# Patient Record
Sex: Female | Born: 1935 | Race: White | Hispanic: No | Marital: Single | Smoking: Former smoker
Health system: Southern US, Community
[De-identification: ages and names within clinical notes are randomized; demographics above are authoritative.]

## PROBLEM LIST (undated history)

## (undated) HISTORY — PX: BACK SURGERY: SHX140

## (undated) HISTORY — PX: TOTAL THYROIDECTOMY: SHX2547

## (undated) HISTORY — PX: MENISCECTOMY: SHX123

## (undated) HISTORY — PX: CATARACT EXTRACTION, BILATERAL: SHX1313

---

## 2010-10-28 ENCOUNTER — Ambulatory Visit (HOSPITAL_COMMUNITY)
Admission: RE | Admit: 2010-10-28 | Discharge: 2010-10-28 | Disposition: A | Discharge status: Home / Self Care | Payer: Medicare Other | Source: Ambulatory Visit | Attending: Orthopedic Surgery | Admitting: Orthopedic Surgery

## 2010-10-28 ENCOUNTER — Other Ambulatory Visit (HOSPITAL_COMMUNITY): Payer: Self-pay | Admitting: Orthopedic Surgery

## 2010-10-28 ENCOUNTER — Encounter (HOSPITAL_COMMUNITY)
Admission: RE | Admit: 2010-10-28 | Discharge: 2010-10-28 | Disposition: A | Discharge status: Home / Self Care | Payer: Medicare Other | Source: Ambulatory Visit | Attending: Orthopedic Surgery | Admitting: Orthopedic Surgery

## 2010-10-28 DIAGNOSIS — Z01812 Encounter for preprocedural laboratory examination: Secondary | ICD-10-CM | POA: Insufficient documentation

## 2010-10-28 DIAGNOSIS — M51379 Other intervertebral disc degeneration, lumbosacral region without mention of lumbar back pain or lower extremity pain: Secondary | ICD-10-CM | POA: Insufficient documentation

## 2010-10-28 DIAGNOSIS — I10 Essential (primary) hypertension: Secondary | ICD-10-CM | POA: Insufficient documentation

## 2010-10-28 DIAGNOSIS — M5136 Other intervertebral disc degeneration, lumbar region: Secondary | ICD-10-CM

## 2010-10-28 DIAGNOSIS — Z01818 Encounter for other preprocedural examination: Secondary | ICD-10-CM | POA: Insufficient documentation

## 2010-10-28 DIAGNOSIS — M5137 Other intervertebral disc degeneration, lumbosacral region: Secondary | ICD-10-CM | POA: Insufficient documentation

## 2010-10-28 LAB — COMPREHENSIVE METABOLIC PANEL
ALT: 36 U/L — ABNORMAL HIGH (ref 0–35)
BUN: 15 mg/dL (ref 6–23)
CO2: 29 mEq/L (ref 19–32)
Calcium: 9.4 mg/dL (ref 8.4–10.5)
GFR calc non Af Amer: >60 mL/min (ref >60)
Glucose, Bld: 109 mg/dL — ABNORMAL HIGH (ref 70–99)
Sodium: 137 mEq/L (ref 135–145)

## 2010-10-28 LAB — URINALYSIS, ROUTINE W REFLEX MICROSCOPIC
Bilirubin Urine: NEGATIVE
Hgb urine dipstick: NEGATIVE
Nitrite: NEGATIVE
Specific Gravity, Urine: 1.014 (ref 1.005–1.030)
Urobilinogen, UA: 0.2 mg/dL (ref 0.0–1.0)
pH: 6 (ref 5.0–8.0)

## 2010-10-28 LAB — DIFFERENTIAL
Basophils Absolute: 0.1 10*3/uL (ref 0.0–0.1)
Basophils Relative: 1 % (ref 0–1)
Lymphocytes Relative: 26 % (ref 12–46)
Neutro Abs: 7.8 10*3/uL — ABNORMAL HIGH (ref 1.7–7.7)
Neutrophils Relative %: 66 % (ref 43–77)

## 2010-10-28 LAB — CBC
HCT: 46.8 % — ABNORMAL HIGH (ref 36.0–46.0)
Hemoglobin: 15.8 g/dL — ABNORMAL HIGH (ref 12.0–15.0)
RBC: 5.42 MIL/uL — ABNORMAL HIGH (ref 3.87–5.11)
WBC: 11.9 10*3/uL — ABNORMAL HIGH (ref 4.0–10.5)

## 2010-10-28 LAB — PROTIME-INR
INR: 0.93 (ref 0.00–1.49)
Prothrombin Time: 12.7 seconds (ref 11.6–15.2)

## 2010-10-28 LAB — SURGICAL PCR SCREEN: Staphylococcus aureus: NEGATIVE

## 2010-10-29 ENCOUNTER — Inpatient Hospital Stay (HOSPITAL_COMMUNITY): Payer: Medicare Other

## 2010-10-29 ENCOUNTER — Inpatient Hospital Stay (HOSPITAL_COMMUNITY)
Admission: RE | Admit: 2010-10-29 | Discharge: 2010-10-30 | DRG: 491 | Disposition: A | Discharge status: Home / Self Care | Payer: Medicare Other | Source: Ambulatory Visit | Attending: Orthopedic Surgery | Admitting: Orthopedic Surgery

## 2010-10-29 ENCOUNTER — Other Ambulatory Visit: Payer: Self-pay | Admitting: Orthopedic Surgery

## 2010-10-29 DIAGNOSIS — E039 Hypothyroidism, unspecified: Secondary | ICD-10-CM | POA: Diagnosis present

## 2010-10-29 DIAGNOSIS — F411 Generalized anxiety disorder: Secondary | ICD-10-CM | POA: Diagnosis present

## 2010-10-29 DIAGNOSIS — M129 Arthropathy, unspecified: Secondary | ICD-10-CM | POA: Diagnosis present

## 2010-10-29 DIAGNOSIS — Z87891 Personal history of nicotine dependence: Secondary | ICD-10-CM

## 2010-10-29 DIAGNOSIS — M5126 Other intervertebral disc displacement, lumbar region: Principal | ICD-10-CM | POA: Diagnosis present

## 2010-10-29 LAB — DIFFERENTIAL
Basophils Relative: 1 % (ref 0–1)
Eosinophils Absolute: 0.2 10*3/uL (ref 0.0–0.7)
Monocytes Absolute: 0.7 10*3/uL (ref 0.1–1.0)
Monocytes Relative: 8 % (ref 3–12)
Neutrophils Relative %: 67 % (ref 43–77)

## 2010-10-29 LAB — CBC
MCH: 29.1 pg (ref 26.0–34.0)
MCHC: 34.2 g/dL (ref 30.0–36.0)
Platelets: 215 10*3/uL (ref 150–400)
RBC: 5.12 MIL/uL — ABNORMAL HIGH (ref 3.87–5.11)

## 2010-11-02 NOTE — Op Note (Signed)
NAME:  Andrea Woodward, Andrea Woodward NO.:  [id number]  MEDICAL RECORD NO.:  [id number]           PATIENT TYPE:  I  LOCATION:  3534                         FACILITY:  [facility]  PHYSICIAN:  Nelda Severe, MD      DATE OF BIRTH:  [birth year]  DATE OF PROCEDURE:  10/29/2010 DATE OF DISCHARGE:                              OPERATIVE REPORT   SURGEON:  Nelda Severe, MD  ASSISTANT:  Lianne Cure, PA-C.  PREOPERATIVE DIAGNOSIS:  Left L4-5 disk herniation with left sciatic pain.  POSTOPERATIVE DIAGNOSIS:  Left L4-5 disk herniation with left sciatic pain.  OPERATIVE PROCEDURE:  Left L4-5 laminotomy and disk excision.  OPERATIVE NOTE:  The patient was placed under general endotracheal anesthesia.  Leg squeezers were applied to both lower extremities. Intravenous vancomycin was given as prophylaxis.  She was positioned prone on the [place] frame.  Care was taken to position the upper extremities so as to avoid hyperflexion and abduction of the shoulders so as to avoid hyperflexion of the elbows.  Upper extremities were padded with foam from axilla to hands.  Thighs, knees, shins, and ankles were supported on pillows.  A vertical midline mark was made with a skin marker at the proposed site of incision over what I perceived to be the L4-5 level.  The lumbar area was prepped with DuraPrep and draped in rectangular fashion, and the drapes were secured with Ioban.  Time-out was then held, at which point, the patient's identity, preoperative diagnosis, intended procedure, etc. etc. were all confirmed/discussed.  The skin was then scored over the previously made skin marker. Subcutaneous tissue was injected with a mixture of 0.25% plain Marcaine and 1% lidocaine with epinephrine.  Incision was then deepened using cutting cautery to the thoracolumbar fascia.  Thoracolumbar fascia was split just left to the midline.  Paraspinal muscle reflected bilaterally and a Kocher clamp was placed  on the trailing edge of what I perceived to be the L4 lamina.  Cross-table lateral radiograph was taken and proved to be L3.  We therefore mobilized the paraspinal muscle one space distally and placed a Cirigliano retractor lateral to the L4-5 apophyseal joint.  A high-speed bur was used to perform medial facetectomy of the inferior articular process on the left at L5 and partial laminectomy. The laminotomy site was then enlarged with Kerrison rongeurs.  The ligamentum flavum was detached laterally from the undersurface of the superior articular process and removed as necessary to expose the dura. We then visualized what appeared initially to be a synovial cyst, proved actually be the nerve root, tented backwards by the underlying disk fragment just medial to the pedicle of the L5 vertebra.  The root was mobilized off the disk fragment and several large fragments which were free in the spinal canal removed with a pituitary rongeur.  Canal was then further swept with a nerve hook and more fragments delivered.  I then moved proximally to the disk space which was incised with a #15 blade.  We then used Epstein curettes to push protruding disk back into the disk space and delivered it with pituitary rongeur.  Multiple fragments  were delivered from inside the disk.  The disk endplates were curetted with a ring curette and more fragments delivered.  At this point, I felt that the nerve root was well decompressed and had made all efforts possible to prevent disk herniation recurrence.  A probe was placed in the disk space and a cross-table lateral radiograph taken to confirm and document that we were at the level we intended to be, namely L4-5.  A 15-gauge Blake drain was then placed subfascially, brought out through the skin to the left side after applying bone wax to the oozing surfaces of the laminectomy bone.  We then closed the thoracolumbar fascia using interrupted #1 Vicryl suture.  The  subcutaneous layer was closed using inverted 2-0 undyed Vicryl and the skin was closed with undyed subcuticular running 3-0 Vicryl.  Steri-Strips were used to reinforce the skin edges and a nonadherent dressing was applied.  The patient's blood loss estimated less than 100 mL.  There were no intraoperative complications.  The patient has not yet awakened, so no neurologic report is made in this operative report.     Nelda Severe, MD     MT/MEDQ  D:  10/29/2010  T:  10/30/2010  Job:  [id number]  Electronically Signed by Nelda Severe MD on 11/02/2010 10:49:37 AM

## 2010-11-25 NOTE — Discharge Summary (Signed)
  NAME:  Andrea Woodward, Andrea Woodward NO.:  [id number]  MEDICAL RECORD NO.:  [id number]           PATIENT TYPE:  I  LOCATION:  3534                         FACILITY:  [facility]  PHYSICIAN:  Nelda Severe, MD      DATE OF BIRTH:  [birth year]  DATE OF ADMISSION:  10/29/2010 DATE OF DISCHARGE:                              DISCHARGE SUMMARY   FINAL DIAGNOSIS:  L4-L5 disk herniation on the left side with left sciatic L5 distribution.  BRIEF HISTORY:  She was brought to [facility] on October 29, 2010, and underwent diskectomy by Dr. Nelda Severe.  Estimated blood loss less than 100 mL.  Grossly neurovascularly motor intact, stable postoperatively.  She was admitted to 3534.  She was ambulating with standby assist.  She states her leg pain is significantly better.  She does have some back pain at the incision, but overall she is very pleased with her outcome.  Distally on exam, neurovascularly motor intact.  I discontinued the drain, put a clean dry dressing over the incision.  PLAN:  She will walk for exercise.  No bending, stooping, lifting more than 5 pounds.  We will send her home with hydrocodone 10/325 one to two q.4 p.r.n. for pain as well as Robaxin 500 mg 1-2 q.6 p.r.n. for muscle spasms.  I told her she is welcome to take over-the-counter Advil and Tylenol if the pain is not significant enough for hydrocodone and she will follow up in office in 4 weeks.  FINAL DIAGNOSIS:  L4-L5 left-sided disk herniation.  She is stable.  DIET:  Regular.     Lianne Cure, P.A.   ______________________________ Nelda Severe, MD    MC/MEDQ  D:  10/30/2010  T:  10/30/2010  Job:  [id number]  Electronically Signed by Lianne Cure P.A. on 11/13/2010 02:53:41 PM Electronically Signed by Nelda Severe MD on 11/25/2010 02:12:55 PM

## 2010-11-25 NOTE — H&P (Signed)
  NAME:  Andrea Woodward, TOOL NO.:  [id number]  MEDICAL RECORD NO.:  [id number]           PATIENT TYPE:  LOCATION:                                 FACILITY:  PHYSICIAN:  Nelda Severe, MD      DATE OF BIRTH:  Dec 28, 1935  DATE OF ADMISSION: DATE OF DISCHARGE:                             HISTORY & PHYSICAL   CHIEF COMPLAINT:  Left leg pain, L4-5 radicular distribution with herniated nucleus pulposus.  PAST MEDICAL HISTORY:  Includes no known drug allergies.  MEDICATIONS:  Include Levoxyl and Klonopin.  She has a history of anxiety and hypothyroidism, history of L4-5 disk annular tear/herniation.  FAMILY HISTORY:  Negative for hypertension.  No cancer.  No coronary artery disease.  No diabetes.  REVIEW OF SYSTEMS:  She reports no fever, no chills.  No nausea, vomiting, or diarrhea.  No hemoptysis.  No melena.  No shortness of breath.  No chest pain.  PHYSICAL EXAMINATION:  HEENT:  She is normocephalic in appearance. Pupils are equal, round, reactive to light. CHEST:  Clear to auscultation.  No wheezes were noted. HEART:  Regular rate and rhythm.  No murmurs were noted. ABDOMEN:  Soft, nontender to palpation.  Positive bowel sounds were auscultated. EXTREMITIES:  She has a positive femoral nerve stretch with radicular pain, left anterior thigh to great toe.  EHL is 0/5.  MRI reviewed, shows L4-5 left-sided herniated nucleus pulposus.  OPERATIVE PLAN:  Diskectomy by Dr. Nelda Severe.     Lianne Cure, P.A.   ______________________________ Nelda Severe, MD    MC/MEDQ  D:  10/25/2010  T:  10/25/2010  Job:  [id number]  Electronically Signed by Lianne Cure P.A. on 11/13/2010 02:53:48 PM Electronically Signed by Nelda Severe MD on 11/25/2010 02:13:10 PM

## 2011-01-07 DIAGNOSIS — I071 Rheumatic tricuspid insufficiency: Secondary | ICD-10-CM | POA: Insufficient documentation

## 2011-01-07 DIAGNOSIS — E890 Postprocedural hypothyroidism: Secondary | ICD-10-CM | POA: Insufficient documentation

## 2012-01-08 ENCOUNTER — Other Ambulatory Visit: Payer: Self-pay | Admitting: Family Medicine

## 2012-01-08 DIAGNOSIS — Z780 Asymptomatic menopausal state: Secondary | ICD-10-CM

## 2012-01-08 DIAGNOSIS — Z1231 Encounter for screening mammogram for malignant neoplasm of breast: Secondary | ICD-10-CM

## 2012-02-06 ENCOUNTER — Other Ambulatory Visit: Payer: PRIVATE HEALTH INSURANCE

## 2012-02-06 ENCOUNTER — Ambulatory Visit: Payer: PRIVATE HEALTH INSURANCE

## 2012-02-23 ENCOUNTER — Ambulatory Visit
Admission: RE | Admit: 2012-02-23 | Discharge: 2012-02-23 | Disposition: A | Discharge status: Home / Self Care | Payer: PRIVATE HEALTH INSURANCE | Source: Ambulatory Visit | Attending: Family Medicine | Admitting: Family Medicine

## 2012-02-23 DIAGNOSIS — Z780 Asymptomatic menopausal state: Secondary | ICD-10-CM

## 2012-02-23 DIAGNOSIS — Z1231 Encounter for screening mammogram for malignant neoplasm of breast: Secondary | ICD-10-CM

## 2012-04-04 DIAGNOSIS — M8589 Other specified disorders of bone density and structure, multiple sites: Secondary | ICD-10-CM | POA: Insufficient documentation

## 2012-04-16 DIAGNOSIS — F411 Generalized anxiety disorder: Secondary | ICD-10-CM | POA: Insufficient documentation

## 2012-08-27 IMAGING — CR DG CHEST 2V
2 series · 2 of 2 positions shown · non-contrast
Comparison: None.

CLINICAL DATA: History of hypertension.  Preoperative
cardiopulmonary evaluation.

CHEST - 2 VIEW

[view not recorded (1 of 2)]
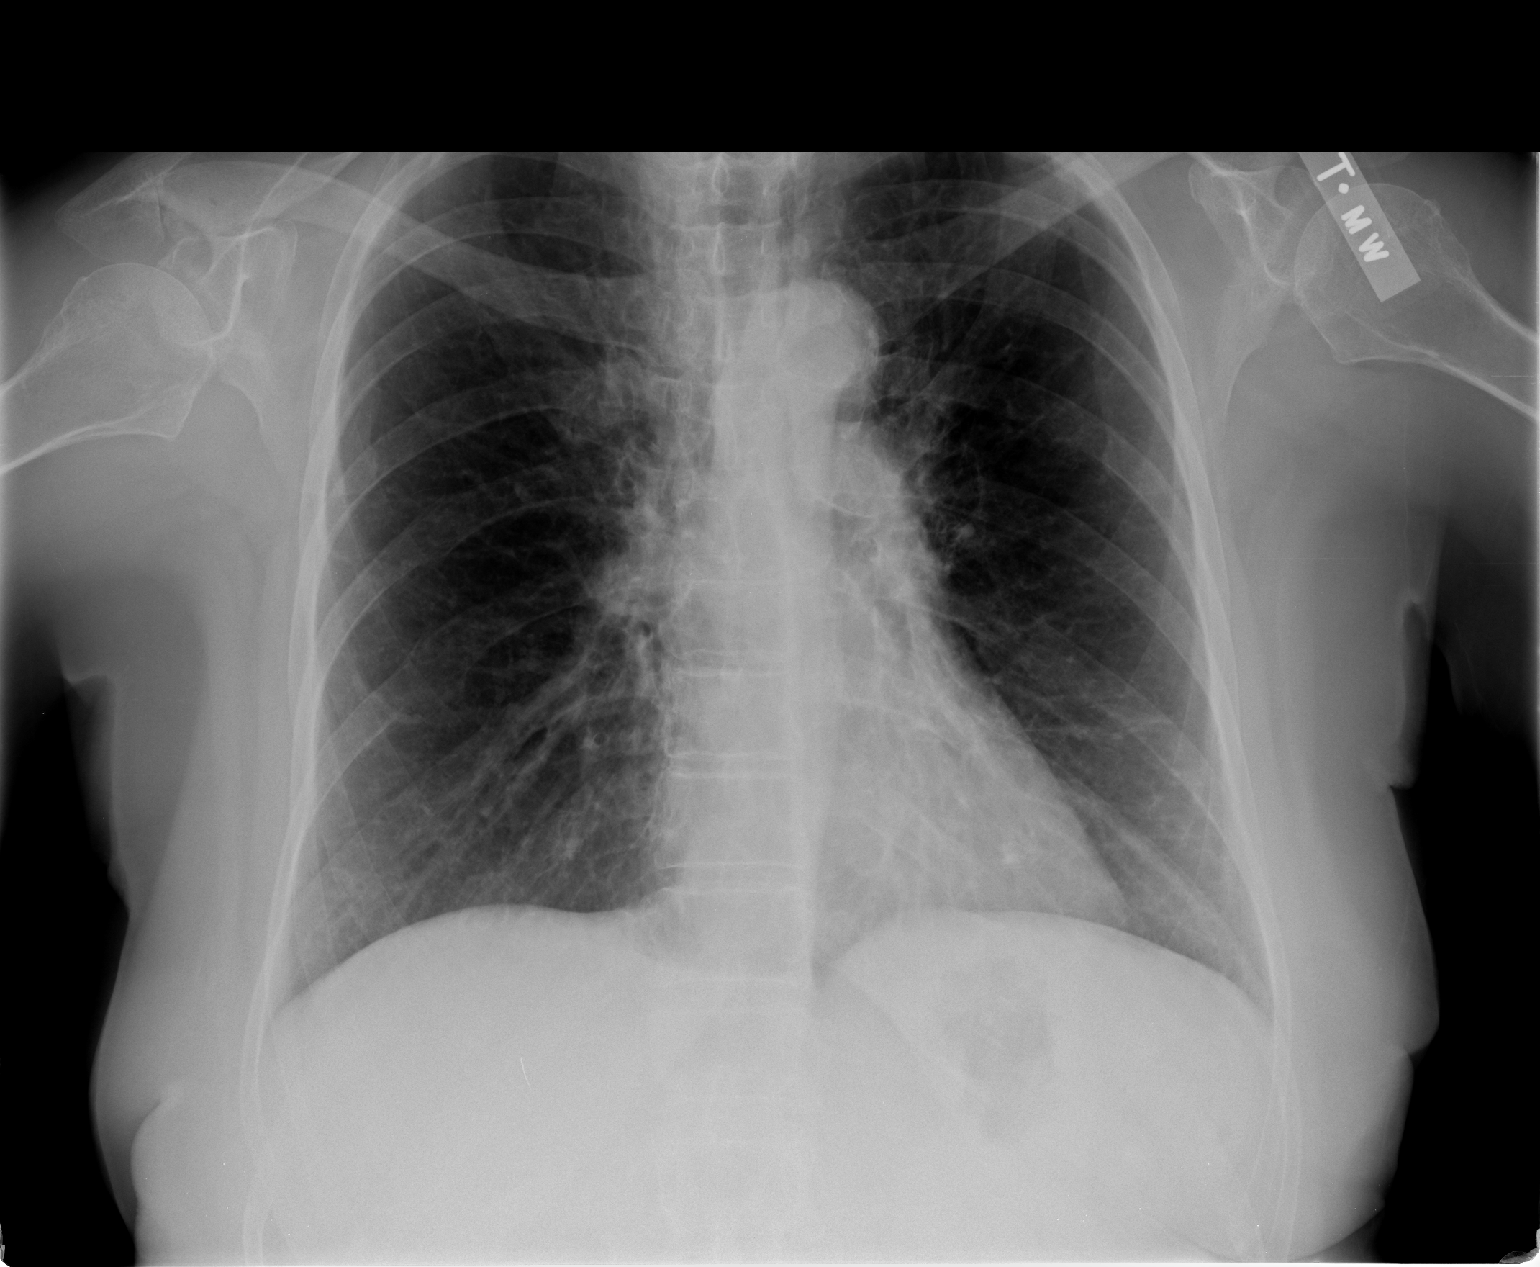

[view not recorded (2 of 2)]
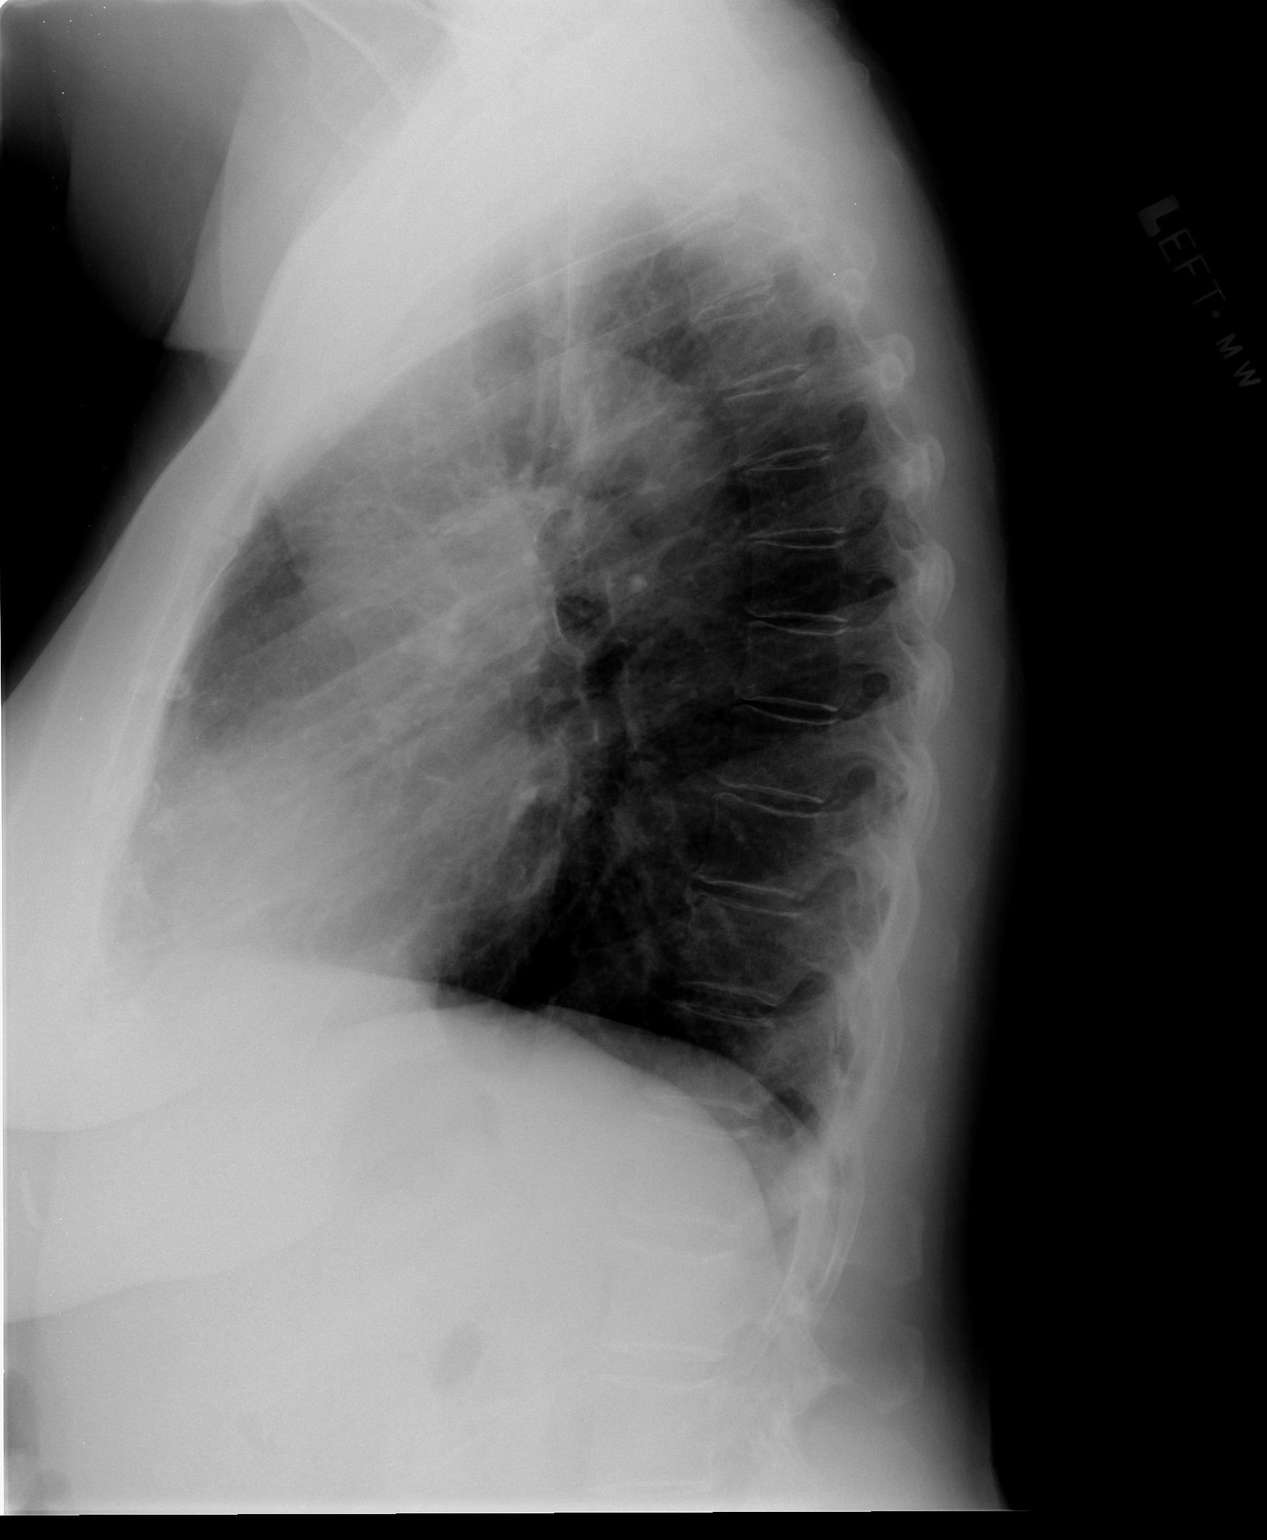

[2 of 2 positions shown; findings below may reference images not displayed]

FINDINGS: The cardiac silhouette is normal size and shape.  The
aortic ectasia is present.  No  hilar enlargement or adenopathy is
evident.  No pulmonary infiltrates are evident.  No pleural
effusion is seen.  There is a slightly osteopenic appearance of the
bones.
IMPRESSION: No acute or active cardiopulmonary process is identified.

## 2012-08-28 IMAGING — CR DG LUMBAR SPINE 2-3V
1 series · 1 of 1 positions shown · non-contrast
Comparison: None

CLINICAL DATA: L4-5 lumbar disc excision.

LUMBAR SPINE - 2-3 VIEW

[view not recorded]
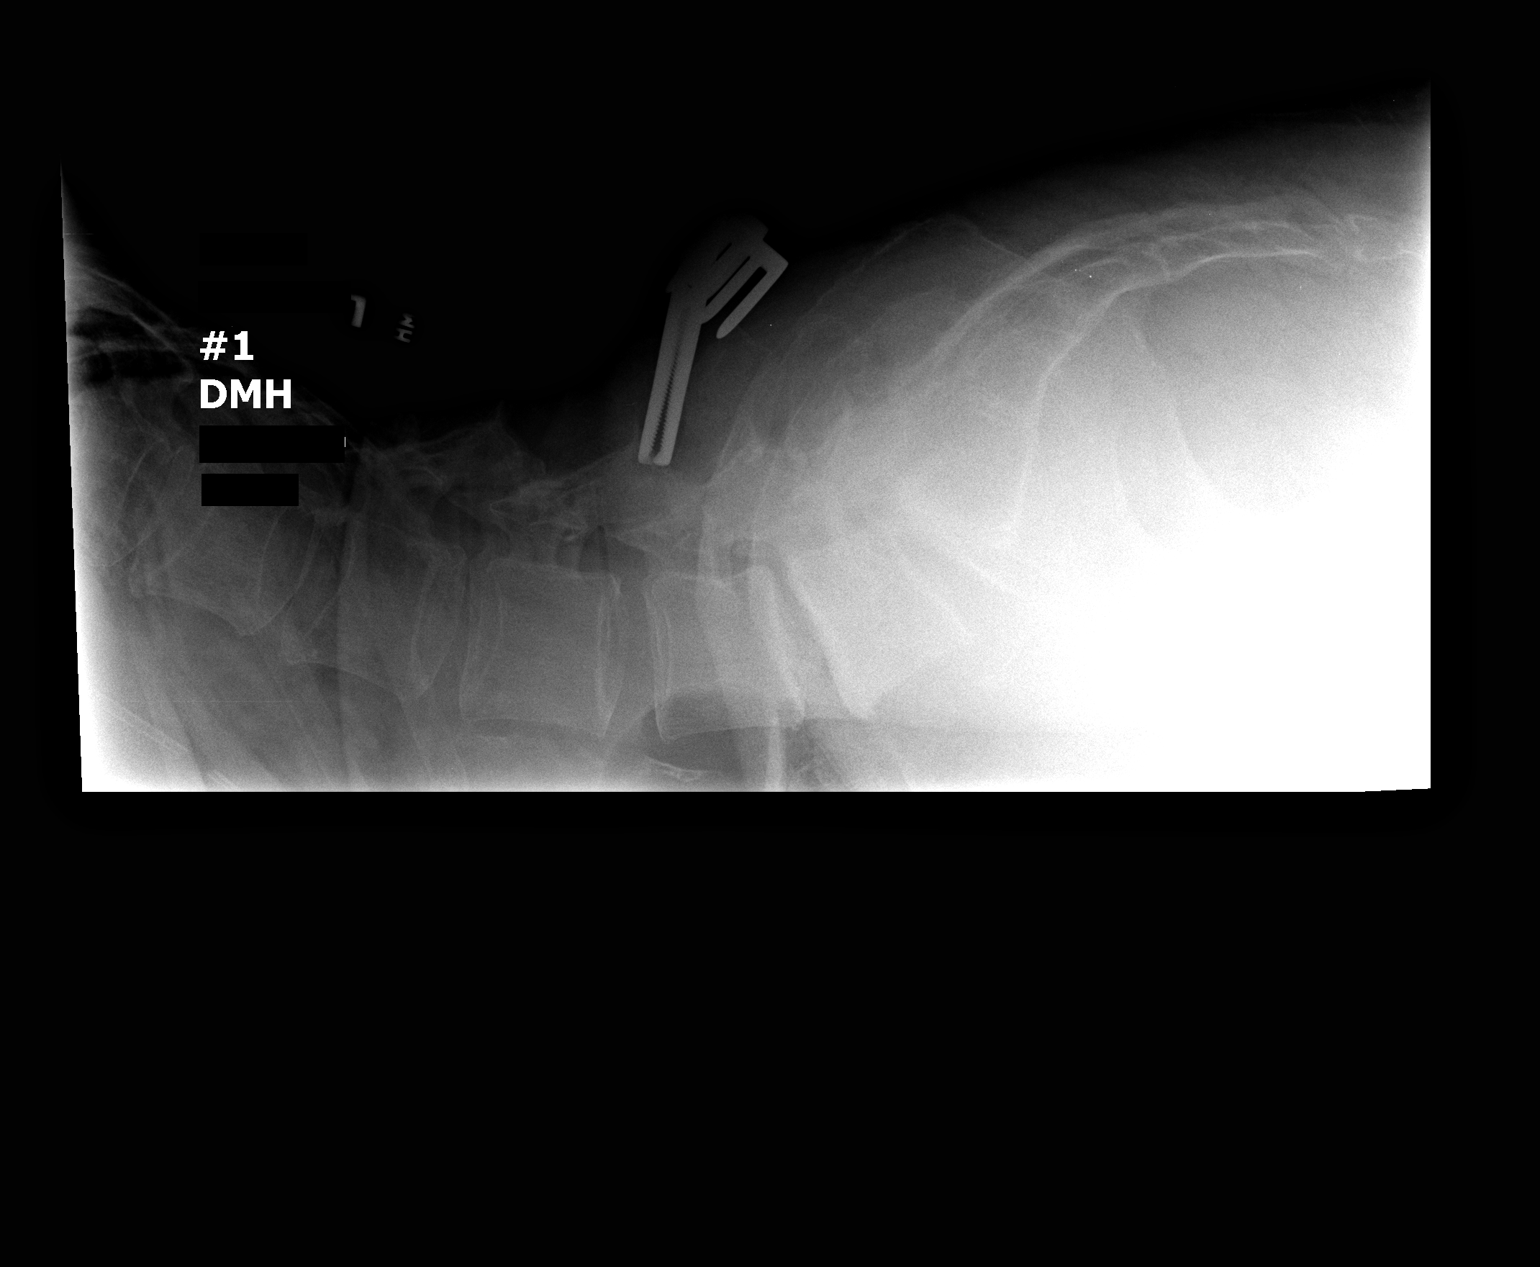

[1 of 1 positions shown; findings below may reference images not displayed]

FINDINGS: The film labeled #1 demonstrates a surgical clamp marking
the L3-4 disc space level.

The film labeled #2 demonstrates a surgical instrument in the L4-5
disc space.
IMPRESSION: Intraoperative localization of L4-5.

## 2014-01-05 DIAGNOSIS — J302 Other seasonal allergic rhinitis: Secondary | ICD-10-CM | POA: Insufficient documentation

## 2014-01-06 ENCOUNTER — Other Ambulatory Visit: Payer: Self-pay | Admitting: Family Medicine

## 2014-01-06 DIAGNOSIS — M810 Age-related osteoporosis without current pathological fracture: Secondary | ICD-10-CM

## 2014-01-11 DIAGNOSIS — E785 Hyperlipidemia, unspecified: Secondary | ICD-10-CM | POA: Insufficient documentation

## 2014-01-11 DIAGNOSIS — E559 Vitamin D deficiency, unspecified: Secondary | ICD-10-CM | POA: Insufficient documentation

## 2014-01-11 DIAGNOSIS — E1169 Type 2 diabetes mellitus with other specified complication: Secondary | ICD-10-CM | POA: Insufficient documentation

## 2014-03-07 ENCOUNTER — Other Ambulatory Visit: Payer: Medicare Other

## 2019-10-01 ENCOUNTER — Ambulatory Visit: Payer: Medicare Other

## 2019-10-06 ENCOUNTER — Ambulatory Visit: Payer: Medicare Other | Attending: Internal Medicine

## 2019-10-06 DIAGNOSIS — Z23 Encounter for immunization: Secondary | ICD-10-CM | POA: Insufficient documentation

## 2019-10-06 NOTE — Progress Notes (Signed)
   Covid-19 Vaccination Clinic  Name:  BARBAR LABELL    MRN: [id number] DOB: 1935/09/22  10/06/2019  Ms. Dasgupta was observed post Covid-19 immunization for 15 minutes without incidence. She was provided with Vaccine Information Sheet and instruction to access the V-Safe system.   Ms. Neylon was instructed to call 911 with any severe reactions post vaccine: Marland Kitchen Difficulty breathing  . Swelling of your face and throat  . A fast heartbeat  . A bad rash all over your body  . Dizziness and weakness    Immunizations Administered    Name Date Dose VIS Date Route   Pfizer COVID-19 Vaccine 10/06/2019  3:34 PM 0.3 mL 08/12/2019 Intramuscular   Manufacturer: [place]   Lot: [id number]   [place]: [id number]

## 2019-10-31 ENCOUNTER — Ambulatory Visit: Payer: Medicare Other | Attending: Internal Medicine

## 2019-10-31 DIAGNOSIS — Z23 Encounter for immunization: Secondary | ICD-10-CM | POA: Insufficient documentation

## 2019-10-31 NOTE — Progress Notes (Signed)
   Covid-19 Vaccination Clinic  Name:  Andrea Woodward    MRN: [id number] DOB: 19-Jan-1936  10/31/2019  [name] was observed post Covid-19 immunization for 15 minutes without incidence. She was provided with Vaccine Information Sheet and instruction to access the V-Safe system.   [name] was instructed to call 911 with any severe reactions post vaccine: Marland Kitchen Difficulty breathing  . Swelling of your face and throat  . A fast heartbeat  . A bad rash all over your body  . Dizziness and weakness    Immunizations Administered    Name Date Dose VIS Date Route   Pfizer COVID-19 Vaccine 10/31/2019  5:11 PM 0.3 mL 08/12/2019 Intramuscular   Manufacturer: [place]   Lot: [id number]   [place]: [id number]

## 2021-02-28 ENCOUNTER — Other Ambulatory Visit: Payer: Self-pay

## 2021-06-24 DIAGNOSIS — R011 Cardiac murmur, unspecified: Secondary | ICD-10-CM | POA: Insufficient documentation

## 2021-07-08 DIAGNOSIS — E1165 Type 2 diabetes mellitus with hyperglycemia: Secondary | ICD-10-CM | POA: Insufficient documentation

## 2021-08-21 DIAGNOSIS — C251 Malignant neoplasm of body of pancreas: Secondary | ICD-10-CM | POA: Insufficient documentation

## 2021-08-21 DIAGNOSIS — R7989 Other specified abnormal findings of blood chemistry: Secondary | ICD-10-CM | POA: Insufficient documentation

## 2021-08-28 ENCOUNTER — Telehealth: Payer: Self-pay

## 2021-08-28 NOTE — Telephone Encounter (Signed)
Attempted to contact patient's sister [name] to schedule a Palliative Care consult appointment. No answer left a message to return call.

## 2021-08-29 ENCOUNTER — Telehealth: Payer: Self-pay

## 2021-08-29 NOTE — Telephone Encounter (Signed)
Spoke with patient's sister [name] and scheduled an in-person Palliative Consult for 09/03/21 @ [facility] screening was negative. Small dog in the home will put away. Patient lives alone but sister and niece live in the same complex.  Consent obtained; updated Outlook/Netsmart/Team List and Epic.   Family is aware they may be receiving a call from provider the day before or day of to confirm appointment.

## 2021-09-03 ENCOUNTER — Encounter: Payer: Self-pay | Admitting: Family Medicine

## 2021-09-03 ENCOUNTER — Other Ambulatory Visit: Payer: Self-pay

## 2021-09-03 ENCOUNTER — Other Ambulatory Visit: Payer: Medicare Other | Admitting: Family Medicine

## 2021-09-03 VITALS — BP 118/70 | HR 83 | Temp 97.1°F | Resp 20

## 2021-09-03 DIAGNOSIS — E785 Hyperlipidemia, unspecified: Secondary | ICD-10-CM

## 2021-09-03 DIAGNOSIS — Z515 Encounter for palliative care: Secondary | ICD-10-CM

## 2021-09-03 DIAGNOSIS — R443 Hallucinations, unspecified: Secondary | ICD-10-CM

## 2021-09-03 DIAGNOSIS — E1169 Type 2 diabetes mellitus with other specified complication: Secondary | ICD-10-CM

## 2021-09-03 DIAGNOSIS — R64 Cachexia: Secondary | ICD-10-CM

## 2021-09-03 DIAGNOSIS — E1165 Type 2 diabetes mellitus with hyperglycemia: Secondary | ICD-10-CM

## 2021-09-03 DIAGNOSIS — R748 Abnormal levels of other serum enzymes: Secondary | ICD-10-CM

## 2021-09-03 DIAGNOSIS — E890 Postprocedural hypothyroidism: Secondary | ICD-10-CM

## 2021-09-03 DIAGNOSIS — C251 Malignant neoplasm of body of pancreas: Secondary | ICD-10-CM

## 2021-09-03 DIAGNOSIS — E43 Unspecified severe protein-calorie malnutrition: Secondary | ICD-10-CM | POA: Insufficient documentation

## 2021-09-03 DIAGNOSIS — R634 Abnormal weight loss: Secondary | ICD-10-CM | POA: Insufficient documentation

## 2021-09-03 DIAGNOSIS — Z20822 Contact with and (suspected) exposure to covid-19: Secondary | ICD-10-CM

## 2021-09-03 HISTORY — DX: Cachexia: R64

## 2021-09-03 HISTORY — DX: Abnormal weight loss: R63.4

## 2021-09-03 HISTORY — DX: Unspecified severe protein-calorie malnutrition: E43

## 2021-09-03 HISTORY — DX: Hallucinations, unspecified: R44.3

## 2021-09-03 NOTE — Progress Notes (Addendum)
[facility] [facility] Palliative Care Consult Note Telephone: [phone]  Fax: [phone]   Date of encounter: 10/02/21 09:15 AM PATIENT NAME: Andrea Woodward [id number]   [phone] (home)  DOB: 22-May-1936 MRN: [id number] PRIMARY CARE PROVIDER:    Junie Spencer, PA-C [place] [address] Phone [phone] Fax [phone]   REFERRING PROVIDER:   Quenten Raven, [facility] [place]   [place], [place] [address]   [phone] (Work)   [phone] (Fax)   RESPONSIBLE PARTY:    Contact Information     Name Relation Home Work Mobile   [place] Sister [phone]  [phone]      Niece Andrea Woodward [phone]  I met face to face with patient and her niece, Andrea Woodward in her home. Palliative Care was asked to follow this patient by consultation request of  Dr Verdell Carmine to address advance care planning and complex medical decision making. This is the initial visit.                                     ASSESSMENT, SYMPTOM MANAGEMENT AND PLAN / RECOMMENDATIONS:   Metastatic pancreatic cancer-Continue follow up with Dr Georgiann Cocker and work up for full staging/biopsy/PET scan to determine treatment options.  Continue current pain management with Fentanyl 25 mcg patch Q 72 hours, Morphine 30 mg IR Q 2 hours prn pain as long as no evidence of respiratory depression.  Educated if pt having dyspnea, difficulty swallowing, cyanotic color changes and/or resp rate < 8 not to give any further meds without discussing with Palliative Care and/or Oncologist.  Believe that pt is appropriate for Hospice level of care but since complete staging not done and determination of what if any treatment may be advised, will continue under Palliative Care with close follow up in 2 weeks after scans done.  Advised that pt can be converted to Hospice expediently if  needed most likely in same day. Advance Care Planning-DNR and MOST completed and left with pt. Unspecified hallucinations-differential is broad and most likely multifactorial.  Will work on elimination of most common etiologies-resuming thyroid medicine, control of blood sugar, monitoring of blood sugar, elimination of infection (asymptomatic at present). Possibly related to tumor and/or pain meds.  Will research safe surrender for benzodiazepines and narcotics to notify niece for disposal. Severe protein calorie malnutrition/cachexia and weight loss-secondary to cancer and anorexia.  Continue to encourage pt sipping high protein supplements. Optimize glycemic control without hypoglycemia. Pt not taking Mirtazapine which could help stimulate appetite. BP stable off Metoprolol Type 2 Diabetes Mellitus vs Type 3c from Pancreatic cancer.  Mild elevation of  LFTs including alk phos and ALT with poor intake a concern for brittle control/hypoglycemia.  For now will encourage pt to check blood sugar fasting and around dinner time, resume Metformin 1000 mg BID (after pt has CT/PET scan due to contrast) unless directed otherwise by supervising physician.  Pt would benefit from CGM to help in managing and allowing for use of insulin. Given LFT elevation advise holding statin for now. Post-surgical hypothyroidism with pt not taking thyroid replacement meds in more than 1 month.  Advised to resume prior dose Levothyroxine 137 mcg immediately and educated that she is dependent on exogenous thyroid replacement for normal function. Exposure to close contact with Covid 19-Advised if she develops  fever, congestion, cough to call the office.   Advance Care Planning/Goals of Care: Goals include to maximize quality of life and symptom management. Patient gave her permission to discuss.Our advance care planning conversation included a discussion about:    The value and importance of advance care planning-they have an  appointment with an attorney tomorrow to set up a health care power of attorney [facility] POA).  Pt wants her sister Andrea Woodward to be her [facility] POA followed by her niece, Andrea Woodward. Experiences with loved ones who have been seriously ill or have died-Pt states prior positive experience when she was living up [facility] with Hospice and Palliative Care. Exploration of personal, cultural or spiritual beliefs that might influence medical decisions-States she does not want to be a burden on her sister and niece Exploration of goals of care in the event of a sudden injury or illness-completed DNR and MOST form with patient Identification and preparation of a healthcare agent  Review and updating or creation of an advance directive document . Decision not to resuscitate or to de-escalate disease focused treatments due to poor prognosis.  CODE STATUS: Do Not Resuscitate (DNR)  MOST: Do not intubate (DNI)/ comfort measures Determine use/limitation of antibiotics when infection occurs No IV fluids or feeding tube    Follow up Palliative Care Visit: Palliative care will continue to follow for complex medical decision making, advance care planning, and clarification of goals. Return 2 weeks or prn as follow up to PET/CT scan for staging/type of metastatic pancreatic cancer.  I spent 118 minutes providing this consultation with 35 minutes being spent in ACP from 10:15 am-10:50 am. More than 50% of the time in this consultation was spent in counseling and care coordination.  This visit was coded based on medical decision making (MDM).  PPS: 40%  HOSPICE ELIGIBILITY/DIAGNOSIS: TBD  Chief Complaint:  Abdominal pain and low back pain due to metastatic pancreatic cancer.   HISTORY OF PRESENT ILLNESS:  Andrea Woodward is a 86 y.o. year old female with metastatic pancreatic cancer who has had trouble getting pain relief of abdominal and low back pain.  Pt has liver metastasis, possibly spleen and a large "inoperable" tumor that  is displacing her abdominal contents and pressing on her spine.  She currently is not eating citing loss of appetite but also lack of taste of foods.  "I've lost 50 or 60 lbs since August." She is followed at the [facility] cancer [facility] in [place] by Dr Verdell Carmine. She states that since August when she first became symptomatic she has been c/o severe pain and has just had pain medicines prescribed and doses adjusted.  She reports not having any scans until recently in December which is when a large pancreatic mass was found. Her niece, Andrea Woodward, expressed significant concern that she was told to place a higher and lower dose Fentanyl patch on patient and give her 2 Morphine pills to control her pain when the patient began indicating she was having trouble breathing and swallowing.  She states the patient refused a call to 911 and she was unsure about administering the Narcan because she had been told this would create a complete reversal of her pain meds.  Advised pt that allowing EMS to be called could offer her and her family in person help but didn't necessarily mean she had to go to the hospital.  Since that time she has a Fentanyl 25 mcg patch on and has been taking her Morphine 30 mg about every 2  hours prn "when she first starts to feel the pain."  No c/o pain since Friday for the most part. Pt is unable to do her ADLs as she is weak.  She is drinking Ensure and V8 juice, eating popsicles and some chicken broth to stay hydrated. Denies nausea/vomiting or postprandial pain after eating. States constipation with no BM in a week. Pt states her pancreatic mass is "inoperable" and that they are trying to determine what, if any, course of treatment to follow but they need to know the particular type of pancreatic tumor she has and she is scheduled 09/10/21 PET scan. CT ABD wo/w Iv contrast 08/19/21 IMPRESSION:    1. Large pancreatic tumor replacing all but the head of the gland and surrounding portions of the  visceral arteries and veins as described above.  2. Liver metastases with trace ascites. Possible splenic metastasis.  3. Cholelithiasis.    Electronically Signed by: Delma Post on 08/19/2021 1:21 PM  Pt states that she just kept getting more medicine and higher doses which weren't controlling her pain so she just quit taking everything but her Fentanyl patch and her Morphine about 1 month ago.  She states she did not think she had diabetes but that her blood sugar was related to the tumor. Her niece brought out pt's meds for review from multiple locations. Pt has multiple duplicate prescriptions including meloxicam, 3 bottles of Clonazepam (2 of which are mostly full), Tramadol 50 mg and 2 prescriptions of hydrocodone with Acetaminophen.  Niece states they have tried to locate a safe surrender site and was told by the [facility] office that they did not take meds.  Review of [facility] records show TSH 25.600 on 08/14/21 with normal Free T4 of 0.89 (attempted to draw blood to run TSH but insufficient amount obtained). Also on 08/14/21 Alkaline phosphatase elevated at 142, ALT at 42 and Calcium at 10.6 with low intact PTH 8 and adequate vitamin D.  Last HGB A1c on 07/08/21 was 9.6%.  Of the medications pt has not been taking her Metformin or her Levothyroxine and has not been checking her bloods sugar.  Blood sugar today was 295 and pt only had single test strip remaining. Niece expressed concern that pt is hallucinating and at times is agitated, stating pt was convinced she had been in her apartment the day prior and was arguing with her, "wanting to throw me out."  She states when pain was at it's worst that pt was begging to die.  Pt has a rescue dog named Leafy Ro which the family plans to take for her. Pt reports chronic numbness of left foot of "years" duration and that she is so weak that she has lightheadedness and difficulty ambulating.  She is noted to walk around her apartment by holding on to the  walls and furniture.  Niece states pt has a walker but when she gets weak that this will not prevent her from falling.  She last had a fall about 2 weeks ago and did hit her head but was not injured.  Pt refuses to use the walker.  Niece states that now her mother, pt's sister has had Covid about 6 days ago and has been treated.  Both pt and niece have spent time with pt's sister more than likely during her infectious period.  Pt denies fever, chills, cough, congestion.  History obtained from review of EMR,  interview with niece Andrea Woodward and Ms. Lovena Le.  I reviewed available labs, medications, imaging, studies and  related documents from the EMR.  Records reviewed and summarized above.   ROS General: NAD, general malaise and fatigue, weak EYES: denies vision changes ENMT: denies dysphagia Cardiovascular: denies chest pain, denies DOE Pulmonary: denies cough, denies increased SOB Abdomen: endorses poor appetite and constipation, endorses continence of bowel GU: denies dysuria, endorses continence of urine MSK:  endorses weakness, 2 falls reported in the last 4-6 weeks Skin: denies rashes or wounds Neurological: denies pain, denies insomnia Psych: intermittently tearful Heme/lymph/immuno: denies bruises, abnormal bleeding  Physical Exam: Current and past weights: Weight 134 lbs as of 08/21/21 loss of 18 lbs from 152 lbs as of 06/25/21 Constitutional: NAD, appears weak and fatigued General: frail appearing EYES: anicteric sclera, lids intact, no discharge  ENMT: intact hearing, oral mucous membranes moist, dentition intact CV: S1S2, RRR with left sternal border murmur, no LE edema Pulmonary: Coarse breath sounds left lung base, otherwise clear, no increased work of breathing, no cough, room air Abdomen: intake fluid only, normo-active BS + 4 quadrants, soft and non tender, moderately distended, no noted fluid wave GU: deferred MSK: noted muscle and subcu fat loss in extremities, moves all  extremities, ambulatory with unsteady gait and holding on to walls/furniture to maintain balance. Skin: warm and dry, no rashes or wounds on visible skin Neuro:  noted generalized weakness,  no cognitive impairment Psych: Flat affect, A and O x 3 Hem/lymph/immuno: no widespread bruising  CURRENT PROBLEM LIST:  Patient Active Problem List   Diagnosis Date Noted   Cachexia ([place]) 09/03/2021   Hallucinations, unspecified 09/03/2021   Weight loss, abnormal 09/03/2021   Severe protein-calorie malnutrition ([place]) 09/03/2021   Elevated ferritin 08/21/2021   Malignant neoplasm of body of pancreas ([place]) 08/21/2021   Type 2 diabetes mellitus with hyperglycemia ([place]) 07/08/2021   Heart murmur, systolic 76/72/0947   Hyperlipidemia associated with type 2 diabetes mellitus ([place]) 01/11/2014   Vitamin D deficiency 01/11/2014   Seasonal allergies 01/05/2014   GAD (generalized anxiety disorder) 04/16/2012   Osteopenia of multiple sites 04/04/2012   Post-surgical hypothyroidism 01/07/2011   Tricuspid regurgitation 01/07/2011   PAST MEDICAL HISTORY:  Active Ambulatory Problems    Diagnosis Date Noted   Elevated ferritin 08/21/2021   GAD (generalized anxiety disorder) 04/16/2012   Heart murmur, systolic 09/62/8366   Hyperlipidemia associated with type 2 diabetes mellitus ([place]) 01/11/2014   Malignant neoplasm of body of pancreas ([place]) 08/21/2021   Osteopenia of multiple sites 04/04/2012   Post-surgical hypothyroidism 01/07/2011   Seasonal allergies 01/05/2014   Tricuspid regurgitation 01/07/2011   Type 2 diabetes mellitus with hyperglycemia ([place]) 07/08/2021   Vitamin D deficiency 01/11/2014   Cachexia ([place]) 09/03/2021   Hallucinations, unspecified 09/03/2021   Weight loss, abnormal 09/03/2021   Severe protein-calorie malnutrition ([place]) 09/03/2021   Resolved Ambulatory Problems    Diagnosis Date Noted   No Resolved Ambulatory Problems   No Additional Past Medical History   SOCIAL HX:   Social History   Tobacco Use   Smoking status: Former    Packs/day: 2.00    Types: Cigarettes    Start date: 1953   Smokeless tobacco: Never   Tobacco comments:    Quit smoking 1990  Substance Use Topics   Alcohol use: Not Currently   FAMILY HX:  Family History  Problem Relation Age of Onset   Cancer Mother    Lung cancer Sister    Diabetes Paternal Grandmother       ALLERGIES: No Known Allergies   PERTINENT MEDICATIONS:  Outpatient Encounter  Medications as of 09/03/2021  Medication Sig   clonazePAM (KLONOPIN) 0.5 MG tablet Take 0.5 mg by mouth 2 (two) times daily as needed for anxiety.   fentaNYL (DURAGESIC) 25 MCG/HR Place 1 patch onto the skin every 3 (three) days.   gabapentin (NEURONTIN) 300 MG capsule Take 300 mg by mouth 3 (three) times daily.   HYDROcodone-acetaminophen (NORCO/VICODIN) 5-325 MG tablet Take 1 tablet by mouth every 6 (six) hours as needed for moderate pain.   levothyroxine (SYNTHROID) 137 MCG tablet Take 137 mcg by mouth daily before breakfast.   meloxicam (MOBIC) 15 MG tablet Take 15 mg by mouth daily. Not taking   metFORMIN (GLUCOPHAGE) 1000 MG tablet Take 1,000 mg by mouth 2 (two) times daily with a meal.   metoprolol tartrate (LOPRESSOR) 25 MG tablet Take 25 mg by mouth 2 (two) times daily.   mirtazapine (REMERON) 15 MG tablet Take 15 mg by mouth at bedtime.   morphine (MSIR) 15 MG tablet Take 30 mg by mouth every 4 (four) hours as needed for severe pain.   naloxone (NARCAN) nasal spray 4 mg/0.1 mL Place 0.4 mg into the nose as needed for opioid reversal.   rosuvastatin (CRESTOR) 20 MG tablet Take 20 mg by mouth daily.   traMADol (ULTRAM) 50 MG tablet Take by mouth every 8 (eight) hours as needed for moderate pain.   No facility-administered encounter medications on file as of 09/03/2021.     Thank you for the opportunity to participate in the care of Ms. Lovena Le.  The palliative care team will continue to follow. Please call our office at  [phone] if we can be of additional assistance.   Marijo Conception, FNP -C  COVID-19 PATIENT SCREENING TOOL Asked and negative response unless otherwise noted:  Have you had symptoms of covid, tested positive or been in contact with someone with symptoms/positive test in the past 5-10 days? Yes, sister tested positive 6 days ago

## 2021-09-05 ENCOUNTER — Telehealth: Payer: Self-pay

## 2021-09-05 NOTE — Telephone Encounter (Signed)
Returned call to sister, Lyndee Leo regarding patient status changing. Lyndee Leo and other family in home reports that patient is declining, having large amounts of watery diarrhea, limited oral intake and is weak.  They originally were planning to pursue another scan to see extent of cancer, but are now wanting hospice evaluation. This RN reported to team, verified hospice eligibility and called DR. Verdell Carmine offs. RN received verbal order for hospice evaluation and to serve as attending MD by Jaclyn Shaggy at MD office. Hospice referral request sent to referral intake at [facility]

## 2021-10-02 DEATH — deceased
